# Patient Record
Sex: Female | Born: 2000 | Hispanic: No | Marital: Single | Smoking: Never smoker
Health system: Southern US, Community
[De-identification: ages and names within clinical notes are randomized; demographics above are authoritative.]

## PROBLEM LIST (undated history)

## (undated) DIAGNOSIS — F419 Anxiety disorder, unspecified: Secondary | ICD-10-CM

## (undated) DIAGNOSIS — H539 Unspecified visual disturbance: Secondary | ICD-10-CM

## (undated) DIAGNOSIS — S82892A Other fracture of left lower leg, initial encounter for closed fracture: Secondary | ICD-10-CM

## (undated) DIAGNOSIS — J45909 Unspecified asthma, uncomplicated: Secondary | ICD-10-CM

## (undated) DIAGNOSIS — E079 Disorder of thyroid, unspecified: Secondary | ICD-10-CM

## (undated) DIAGNOSIS — T7840XA Allergy, unspecified, initial encounter: Secondary | ICD-10-CM

## (undated) HISTORY — DX: Anxiety disorder, unspecified: F41.9

## (undated) HISTORY — DX: Disorder of thyroid, unspecified: E07.9

## (undated) HISTORY — PX: ANKLE ARTHROSCOPY W/ OPEN REPAIR: SHX1145

---
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
## 2020-01-25 DIAGNOSIS — F331 Major depressive disorder, recurrent, moderate: Secondary | ICD-10-CM | POA: Diagnosis not present

## 2020-02-13 DIAGNOSIS — F331 Major depressive disorder, recurrent, moderate: Secondary | ICD-10-CM | POA: Diagnosis not present

## 2020-02-20 DIAGNOSIS — F411 Generalized anxiety disorder: Secondary | ICD-10-CM | POA: Diagnosis not present

## 2020-02-20 DIAGNOSIS — F331 Major depressive disorder, recurrent, moderate: Secondary | ICD-10-CM | POA: Diagnosis not present

## 2020-02-22 DIAGNOSIS — F411 Generalized anxiety disorder: Secondary | ICD-10-CM | POA: Diagnosis not present

## 2020-02-22 DIAGNOSIS — F331 Major depressive disorder, recurrent, moderate: Secondary | ICD-10-CM | POA: Diagnosis not present

## 2020-02-29 DIAGNOSIS — H10413 Chronic giant papillary conjunctivitis, bilateral: Secondary | ICD-10-CM | POA: Diagnosis not present

## 2020-02-29 DIAGNOSIS — H52203 Unspecified astigmatism, bilateral: Secondary | ICD-10-CM | POA: Diagnosis not present

## 2020-02-29 DIAGNOSIS — H0102A Squamous blepharitis right eye, upper and lower eyelids: Secondary | ICD-10-CM | POA: Diagnosis not present

## 2020-02-29 DIAGNOSIS — H0102B Squamous blepharitis left eye, upper and lower eyelids: Secondary | ICD-10-CM | POA: Diagnosis not present

## 2020-02-29 DIAGNOSIS — H16423 Pannus (corneal), bilateral: Secondary | ICD-10-CM | POA: Diagnosis not present

## 2020-02-29 DIAGNOSIS — H5213 Myopia, bilateral: Secondary | ICD-10-CM | POA: Diagnosis not present

## 2020-03-01 DIAGNOSIS — F411 Generalized anxiety disorder: Secondary | ICD-10-CM | POA: Diagnosis not present

## 2020-03-19 DIAGNOSIS — H6123 Impacted cerumen, bilateral: Secondary | ICD-10-CM | POA: Diagnosis not present

## 2020-03-20 DIAGNOSIS — F411 Generalized anxiety disorder: Secondary | ICD-10-CM | POA: Diagnosis not present

## 2020-03-20 DIAGNOSIS — F331 Major depressive disorder, recurrent, moderate: Secondary | ICD-10-CM | POA: Diagnosis not present

## 2020-03-21 DIAGNOSIS — F411 Generalized anxiety disorder: Secondary | ICD-10-CM | POA: Diagnosis not present

## 2020-03-21 DIAGNOSIS — F331 Major depressive disorder, recurrent, moderate: Secondary | ICD-10-CM | POA: Diagnosis not present

## 2020-03-25 DIAGNOSIS — J029 Acute pharyngitis, unspecified: Secondary | ICD-10-CM | POA: Diagnosis not present

## 2020-03-25 DIAGNOSIS — Z1152 Encounter for screening for COVID-19: Secondary | ICD-10-CM | POA: Diagnosis not present

## 2020-03-27 DIAGNOSIS — F331 Major depressive disorder, recurrent, moderate: Secondary | ICD-10-CM | POA: Diagnosis not present

## 2020-03-28 DIAGNOSIS — F331 Major depressive disorder, recurrent, moderate: Secondary | ICD-10-CM | POA: Diagnosis not present

## 2020-03-28 DIAGNOSIS — F411 Generalized anxiety disorder: Secondary | ICD-10-CM | POA: Diagnosis not present

## 2020-04-01 DIAGNOSIS — L680 Hirsutism: Secondary | ICD-10-CM | POA: Diagnosis not present

## 2020-04-01 DIAGNOSIS — Z23 Encounter for immunization: Secondary | ICD-10-CM | POA: Diagnosis not present

## 2020-04-01 DIAGNOSIS — Z0001 Encounter for general adult medical examination with abnormal findings: Secondary | ICD-10-CM | POA: Diagnosis not present

## 2020-04-01 DIAGNOSIS — E039 Hypothyroidism, unspecified: Secondary | ICD-10-CM | POA: Diagnosis not present

## 2020-04-01 DIAGNOSIS — Z1331 Encounter for screening for depression: Secondary | ICD-10-CM | POA: Diagnosis not present

## 2020-04-01 DIAGNOSIS — L700 Acne vulgaris: Secondary | ICD-10-CM | POA: Diagnosis not present

## 2020-04-02 DIAGNOSIS — F331 Major depressive disorder, recurrent, moderate: Secondary | ICD-10-CM | POA: Diagnosis not present

## 2020-04-22 DIAGNOSIS — F331 Major depressive disorder, recurrent, moderate: Secondary | ICD-10-CM | POA: Diagnosis not present

## 2020-04-26 DIAGNOSIS — E039 Hypothyroidism, unspecified: Secondary | ICD-10-CM | POA: Diagnosis not present

## 2020-04-30 DIAGNOSIS — E039 Hypothyroidism, unspecified: Secondary | ICD-10-CM | POA: Diagnosis not present

## 2020-05-05 DIAGNOSIS — Z20822 Contact with and (suspected) exposure to covid-19: Secondary | ICD-10-CM | POA: Diagnosis not present

## 2020-06-19 DIAGNOSIS — H6123 Impacted cerumen, bilateral: Secondary | ICD-10-CM | POA: Diagnosis not present

## 2020-06-19 DIAGNOSIS — H61303 Acquired stenosis of external ear canal, unspecified, bilateral: Secondary | ICD-10-CM | POA: Diagnosis not present

## 2020-06-19 DIAGNOSIS — H60332 Swimmer's ear, left ear: Secondary | ICD-10-CM | POA: Diagnosis not present

## 2020-06-20 DIAGNOSIS — J4521 Mild intermittent asthma with (acute) exacerbation: Secondary | ICD-10-CM | POA: Diagnosis not present

## 2020-06-20 DIAGNOSIS — J019 Acute sinusitis, unspecified: Secondary | ICD-10-CM | POA: Diagnosis not present

## 2020-07-01 DIAGNOSIS — H61303 Acquired stenosis of external ear canal, unspecified, bilateral: Secondary | ICD-10-CM | POA: Diagnosis not present

## 2020-07-01 DIAGNOSIS — H6123 Impacted cerumen, bilateral: Secondary | ICD-10-CM | POA: Diagnosis not present

## 2020-07-01 DIAGNOSIS — H60332 Swimmer's ear, left ear: Secondary | ICD-10-CM | POA: Diagnosis not present

## 2020-07-18 DIAGNOSIS — S93492D Sprain of other ligament of left ankle, subsequent encounter: Secondary | ICD-10-CM | POA: Diagnosis not present

## 2020-07-24 ENCOUNTER — Ambulatory Visit: Payer: BLUE CROSS/BLUE SHIELD | Admitting: Family Medicine

## 2020-08-14 ENCOUNTER — Ambulatory Visit: Payer: BLUE CROSS/BLUE SHIELD | Admitting: Family Medicine

## 2020-08-28 DIAGNOSIS — H6123 Impacted cerumen, bilateral: Secondary | ICD-10-CM | POA: Diagnosis not present

## 2020-09-30 DIAGNOSIS — Z20828 Contact with and (suspected) exposure to other viral communicable diseases: Secondary | ICD-10-CM | POA: Diagnosis not present

## 2020-09-30 DIAGNOSIS — B338 Other specified viral diseases: Secondary | ICD-10-CM | POA: Diagnosis not present

## 2020-11-11 DIAGNOSIS — Z0000 Encounter for general adult medical examination without abnormal findings: Secondary | ICD-10-CM | POA: Diagnosis not present

## 2020-11-11 DIAGNOSIS — E559 Vitamin D deficiency, unspecified: Secondary | ICD-10-CM | POA: Diagnosis not present

## 2020-11-11 DIAGNOSIS — E039 Hypothyroidism, unspecified: Secondary | ICD-10-CM | POA: Diagnosis not present

## 2020-11-12 DIAGNOSIS — E039 Hypothyroidism, unspecified: Secondary | ICD-10-CM | POA: Diagnosis not present

## 2020-11-15 DIAGNOSIS — H52223 Regular astigmatism, bilateral: Secondary | ICD-10-CM | POA: Diagnosis not present

## 2020-12-12 DIAGNOSIS — J069 Acute upper respiratory infection, unspecified: Secondary | ICD-10-CM | POA: Diagnosis not present

## 2020-12-12 DIAGNOSIS — Z23 Encounter for immunization: Secondary | ICD-10-CM | POA: Diagnosis not present

## 2021-04-07 DIAGNOSIS — E039 Hypothyroidism, unspecified: Secondary | ICD-10-CM | POA: Diagnosis not present

## 2021-04-07 DIAGNOSIS — Z1331 Encounter for screening for depression: Secondary | ICD-10-CM | POA: Diagnosis not present

## 2021-04-07 DIAGNOSIS — Z0001 Encounter for general adult medical examination with abnormal findings: Secondary | ICD-10-CM | POA: Diagnosis not present

## 2021-04-07 DIAGNOSIS — Z6828 Body mass index (BMI) 28.0-28.9, adult: Secondary | ICD-10-CM | POA: Diagnosis not present

## 2021-04-07 DIAGNOSIS — Z23 Encounter for immunization: Secondary | ICD-10-CM | POA: Diagnosis not present

## 2021-04-07 DIAGNOSIS — F419 Anxiety disorder, unspecified: Secondary | ICD-10-CM | POA: Diagnosis not present

## 2021-04-07 DIAGNOSIS — Z0000 Encounter for general adult medical examination without abnormal findings: Secondary | ICD-10-CM | POA: Diagnosis not present

## 2021-04-07 DIAGNOSIS — Z713 Dietary counseling and surveillance: Secondary | ICD-10-CM | POA: Diagnosis not present

## 2021-04-09 DIAGNOSIS — E039 Hypothyroidism, unspecified: Secondary | ICD-10-CM | POA: Diagnosis not present

## 2021-07-21 DIAGNOSIS — J019 Acute sinusitis, unspecified: Secondary | ICD-10-CM | POA: Diagnosis not present

## 2021-07-21 DIAGNOSIS — J029 Acute pharyngitis, unspecified: Secondary | ICD-10-CM | POA: Diagnosis not present

## 2021-07-25 DIAGNOSIS — J4521 Mild intermittent asthma with (acute) exacerbation: Secondary | ICD-10-CM | POA: Diagnosis not present

## 2021-07-25 DIAGNOSIS — J209 Acute bronchitis, unspecified: Secondary | ICD-10-CM | POA: Diagnosis not present

## 2021-08-15 DIAGNOSIS — M549 Dorsalgia, unspecified: Secondary | ICD-10-CM | POA: Diagnosis not present

## 2021-08-15 DIAGNOSIS — R300 Dysuria: Secondary | ICD-10-CM | POA: Diagnosis not present

## 2021-09-08 DIAGNOSIS — Z79899 Other long term (current) drug therapy: Secondary | ICD-10-CM | POA: Diagnosis not present

## 2021-09-08 DIAGNOSIS — E039 Hypothyroidism, unspecified: Secondary | ICD-10-CM | POA: Diagnosis not present

## 2021-09-08 DIAGNOSIS — E559 Vitamin D deficiency, unspecified: Secondary | ICD-10-CM | POA: Diagnosis not present

## 2021-09-11 DIAGNOSIS — E039 Hypothyroidism, unspecified: Secondary | ICD-10-CM | POA: Diagnosis not present

## 2021-10-30 DIAGNOSIS — F411 Generalized anxiety disorder: Secondary | ICD-10-CM | POA: Diagnosis not present

## 2021-10-30 DIAGNOSIS — E039 Hypothyroidism, unspecified: Secondary | ICD-10-CM | POA: Diagnosis not present

## 2021-10-30 DIAGNOSIS — Z91018 Allergy to other foods: Secondary | ICD-10-CM | POA: Diagnosis not present

## 2021-10-30 DIAGNOSIS — F330 Major depressive disorder, recurrent, mild: Secondary | ICD-10-CM | POA: Diagnosis not present

## 2022-02-11 DIAGNOSIS — S6392XA Sprain of unspecified part of left wrist and hand, initial encounter: Secondary | ICD-10-CM | POA: Diagnosis not present

## 2022-02-18 DIAGNOSIS — H9202 Otalgia, left ear: Secondary | ICD-10-CM | POA: Diagnosis not present

## 2022-02-18 DIAGNOSIS — Z77122 Contact with and (suspected) exposure to noise: Secondary | ICD-10-CM | POA: Diagnosis not present

## 2022-02-18 DIAGNOSIS — H9313 Tinnitus, bilateral: Secondary | ICD-10-CM | POA: Diagnosis not present

## 2022-02-18 DIAGNOSIS — H6123 Impacted cerumen, bilateral: Secondary | ICD-10-CM | POA: Diagnosis not present

## 2022-02-18 DIAGNOSIS — H9312 Tinnitus, left ear: Secondary | ICD-10-CM | POA: Diagnosis not present

## 2022-02-19 DIAGNOSIS — R0982 Postnasal drip: Secondary | ICD-10-CM | POA: Diagnosis not present

## 2022-02-19 DIAGNOSIS — Z03818 Encounter for observation for suspected exposure to other biological agents ruled out: Secondary | ICD-10-CM | POA: Diagnosis not present

## 2022-02-19 DIAGNOSIS — J029 Acute pharyngitis, unspecified: Secondary | ICD-10-CM | POA: Diagnosis not present

## 2022-03-19 DIAGNOSIS — E041 Nontoxic single thyroid nodule: Secondary | ICD-10-CM | POA: Diagnosis not present

## 2022-03-19 DIAGNOSIS — E559 Vitamin D deficiency, unspecified: Secondary | ICD-10-CM | POA: Diagnosis not present

## 2022-03-19 DIAGNOSIS — E039 Hypothyroidism, unspecified: Secondary | ICD-10-CM | POA: Diagnosis not present

## 2022-03-20 DIAGNOSIS — E039 Hypothyroidism, unspecified: Secondary | ICD-10-CM | POA: Diagnosis not present

## 2022-03-27 DIAGNOSIS — M25571 Pain in right ankle and joints of right foot: Secondary | ICD-10-CM | POA: Diagnosis not present

## 2022-04-08 DIAGNOSIS — M79674 Pain in right toe(s): Secondary | ICD-10-CM | POA: Diagnosis not present

## 2022-04-16 DIAGNOSIS — M79671 Pain in right foot: Secondary | ICD-10-CM | POA: Diagnosis not present

## 2022-04-17 DIAGNOSIS — M79674 Pain in right toe(s): Secondary | ICD-10-CM | POA: Diagnosis not present

## 2022-05-15 DIAGNOSIS — M79674 Pain in right toe(s): Secondary | ICD-10-CM | POA: Diagnosis not present

## 2022-06-12 DIAGNOSIS — R300 Dysuria: Secondary | ICD-10-CM | POA: Diagnosis not present

## 2022-06-12 DIAGNOSIS — M79671 Pain in right foot: Secondary | ICD-10-CM | POA: Diagnosis not present

## 2022-06-12 DIAGNOSIS — N898 Other specified noninflammatory disorders of vagina: Secondary | ICD-10-CM | POA: Diagnosis not present

## 2022-06-15 DIAGNOSIS — M79674 Pain in right toe(s): Secondary | ICD-10-CM | POA: Diagnosis not present

## 2022-06-29 DIAGNOSIS — M79674 Pain in right toe(s): Secondary | ICD-10-CM | POA: Diagnosis not present

## 2022-06-29 DIAGNOSIS — M79671 Pain in right foot: Secondary | ICD-10-CM | POA: Diagnosis not present

## 2022-07-01 ENCOUNTER — Other Ambulatory Visit: Payer: Self-pay | Admitting: Orthopaedic Surgery

## 2022-07-01 DIAGNOSIS — M79671 Pain in right foot: Secondary | ICD-10-CM

## 2022-07-02 ENCOUNTER — Ambulatory Visit: Payer: BLUE CROSS/BLUE SHIELD | Admitting: Podiatry

## 2022-07-12 ENCOUNTER — Ambulatory Visit
Admission: RE | Admit: 2022-07-12 | Discharge: 2022-07-12 | Disposition: A | Discharge status: Home / Self Care | Payer: BLUE CROSS/BLUE SHIELD | Source: Ambulatory Visit | Attending: Orthopaedic Surgery | Admitting: Orthopaedic Surgery

## 2022-07-12 DIAGNOSIS — R600 Localized edema: Secondary | ICD-10-CM | POA: Diagnosis not present

## 2022-07-12 DIAGNOSIS — M25474 Effusion, right foot: Secondary | ICD-10-CM | POA: Diagnosis not present

## 2022-07-12 DIAGNOSIS — M79671 Pain in right foot: Secondary | ICD-10-CM

## 2022-07-15 DIAGNOSIS — M79674 Pain in right toe(s): Secondary | ICD-10-CM | POA: Diagnosis not present

## 2022-07-20 ENCOUNTER — Other Ambulatory Visit: Payer: BLUE CROSS/BLUE SHIELD

## 2022-08-12 DIAGNOSIS — M79674 Pain in right toe(s): Secondary | ICD-10-CM | POA: Diagnosis not present

## 2022-08-24 DIAGNOSIS — Z0110 Encounter for examination of ears and hearing without abnormal findings: Secondary | ICD-10-CM | POA: Diagnosis not present

## 2022-08-24 DIAGNOSIS — H6123 Impacted cerumen, bilateral: Secondary | ICD-10-CM | POA: Diagnosis not present

## 2022-08-24 DIAGNOSIS — H9313 Tinnitus, bilateral: Secondary | ICD-10-CM | POA: Diagnosis not present

## 2022-08-24 DIAGNOSIS — H93293 Other abnormal auditory perceptions, bilateral: Secondary | ICD-10-CM | POA: Diagnosis not present

## 2022-09-08 DIAGNOSIS — M25674 Stiffness of right foot, not elsewhere classified: Secondary | ICD-10-CM | POA: Diagnosis not present

## 2022-09-08 DIAGNOSIS — M25671 Stiffness of right ankle, not elsewhere classified: Secondary | ICD-10-CM | POA: Diagnosis not present

## 2022-09-08 DIAGNOSIS — R262 Difficulty in walking, not elsewhere classified: Secondary | ICD-10-CM | POA: Diagnosis not present

## 2022-09-09 DIAGNOSIS — M25674 Stiffness of right foot, not elsewhere classified: Secondary | ICD-10-CM | POA: Diagnosis not present

## 2022-09-09 DIAGNOSIS — M25671 Stiffness of right ankle, not elsewhere classified: Secondary | ICD-10-CM | POA: Diagnosis not present

## 2022-09-09 DIAGNOSIS — R262 Difficulty in walking, not elsewhere classified: Secondary | ICD-10-CM | POA: Diagnosis not present

## 2022-09-14 DIAGNOSIS — R262 Difficulty in walking, not elsewhere classified: Secondary | ICD-10-CM | POA: Diagnosis not present

## 2022-09-14 DIAGNOSIS — M25671 Stiffness of right ankle, not elsewhere classified: Secondary | ICD-10-CM | POA: Diagnosis not present

## 2022-09-14 DIAGNOSIS — M25674 Stiffness of right foot, not elsewhere classified: Secondary | ICD-10-CM | POA: Diagnosis not present

## 2022-09-18 DIAGNOSIS — M25671 Stiffness of right ankle, not elsewhere classified: Secondary | ICD-10-CM | POA: Diagnosis not present

## 2022-09-18 DIAGNOSIS — M25674 Stiffness of right foot, not elsewhere classified: Secondary | ICD-10-CM | POA: Diagnosis not present

## 2022-09-18 DIAGNOSIS — R262 Difficulty in walking, not elsewhere classified: Secondary | ICD-10-CM | POA: Diagnosis not present

## 2022-09-30 DIAGNOSIS — R262 Difficulty in walking, not elsewhere classified: Secondary | ICD-10-CM | POA: Diagnosis not present

## 2022-09-30 DIAGNOSIS — M25674 Stiffness of right foot, not elsewhere classified: Secondary | ICD-10-CM | POA: Diagnosis not present

## 2022-09-30 DIAGNOSIS — M25671 Stiffness of right ankle, not elsewhere classified: Secondary | ICD-10-CM | POA: Diagnosis not present

## 2022-10-02 DIAGNOSIS — M25674 Stiffness of right foot, not elsewhere classified: Secondary | ICD-10-CM | POA: Diagnosis not present

## 2022-10-02 DIAGNOSIS — M25671 Stiffness of right ankle, not elsewhere classified: Secondary | ICD-10-CM | POA: Diagnosis not present

## 2022-10-02 DIAGNOSIS — R262 Difficulty in walking, not elsewhere classified: Secondary | ICD-10-CM | POA: Diagnosis not present

## 2022-10-07 DIAGNOSIS — R262 Difficulty in walking, not elsewhere classified: Secondary | ICD-10-CM | POA: Diagnosis not present

## 2022-10-07 DIAGNOSIS — M25674 Stiffness of right foot, not elsewhere classified: Secondary | ICD-10-CM | POA: Diagnosis not present

## 2022-10-07 DIAGNOSIS — M25671 Stiffness of right ankle, not elsewhere classified: Secondary | ICD-10-CM | POA: Diagnosis not present

## 2022-10-14 DIAGNOSIS — R262 Difficulty in walking, not elsewhere classified: Secondary | ICD-10-CM | POA: Diagnosis not present

## 2022-10-14 DIAGNOSIS — M25671 Stiffness of right ankle, not elsewhere classified: Secondary | ICD-10-CM | POA: Diagnosis not present

## 2022-10-14 DIAGNOSIS — M25674 Stiffness of right foot, not elsewhere classified: Secondary | ICD-10-CM | POA: Diagnosis not present

## 2022-10-15 DIAGNOSIS — M25562 Pain in left knee: Secondary | ICD-10-CM | POA: Diagnosis not present

## 2022-10-26 DIAGNOSIS — L259 Unspecified contact dermatitis, unspecified cause: Secondary | ICD-10-CM | POA: Diagnosis not present

## 2022-11-05 DIAGNOSIS — E039 Hypothyroidism, unspecified: Secondary | ICD-10-CM | POA: Diagnosis not present

## 2022-11-10 DIAGNOSIS — E559 Vitamin D deficiency, unspecified: Secondary | ICD-10-CM | POA: Diagnosis not present

## 2022-11-10 DIAGNOSIS — E039 Hypothyroidism, unspecified: Secondary | ICD-10-CM | POA: Diagnosis not present

## 2022-11-11 DIAGNOSIS — J069 Acute upper respiratory infection, unspecified: Secondary | ICD-10-CM | POA: Diagnosis not present

## 2022-12-25 DIAGNOSIS — H52223 Regular astigmatism, bilateral: Secondary | ICD-10-CM | POA: Diagnosis not present

## 2023-02-23 DIAGNOSIS — H6123 Impacted cerumen, bilateral: Secondary | ICD-10-CM | POA: Diagnosis not present

## 2023-03-03 DIAGNOSIS — Z124 Encounter for screening for malignant neoplasm of cervix: Secondary | ICD-10-CM | POA: Diagnosis not present

## 2023-03-03 DIAGNOSIS — X509XXA Other and unspecified overexertion or strenuous movements or postures, initial encounter: Secondary | ICD-10-CM | POA: Diagnosis not present

## 2023-03-03 DIAGNOSIS — E039 Hypothyroidism, unspecified: Secondary | ICD-10-CM | POA: Diagnosis not present

## 2023-03-03 DIAGNOSIS — Z0000 Encounter for general adult medical examination without abnormal findings: Secondary | ICD-10-CM | POA: Diagnosis not present

## 2023-03-03 DIAGNOSIS — E559 Vitamin D deficiency, unspecified: Secondary | ICD-10-CM | POA: Diagnosis not present

## 2023-03-03 DIAGNOSIS — Z13220 Encounter for screening for lipoid disorders: Secondary | ICD-10-CM | POA: Diagnosis not present

## 2023-03-03 DIAGNOSIS — R42 Dizziness and giddiness: Secondary | ICD-10-CM | POA: Diagnosis not present

## 2023-03-24 DIAGNOSIS — J069 Acute upper respiratory infection, unspecified: Secondary | ICD-10-CM | POA: Diagnosis not present

## 2023-03-24 DIAGNOSIS — R059 Cough, unspecified: Secondary | ICD-10-CM | POA: Diagnosis not present

## 2023-05-03 DIAGNOSIS — E039 Hypothyroidism, unspecified: Secondary | ICD-10-CM | POA: Diagnosis not present

## 2023-05-03 DIAGNOSIS — E559 Vitamin D deficiency, unspecified: Secondary | ICD-10-CM | POA: Diagnosis not present

## 2023-05-05 DIAGNOSIS — M79671 Pain in right foot: Secondary | ICD-10-CM | POA: Diagnosis not present

## 2023-05-06 DIAGNOSIS — E559 Vitamin D deficiency, unspecified: Secondary | ICD-10-CM | POA: Diagnosis not present

## 2023-05-06 DIAGNOSIS — E039 Hypothyroidism, unspecified: Secondary | ICD-10-CM | POA: Diagnosis not present

## 2023-05-18 DIAGNOSIS — E039 Hypothyroidism, unspecified: Secondary | ICD-10-CM | POA: Diagnosis not present

## 2023-08-24 DIAGNOSIS — H6123 Impacted cerumen, bilateral: Secondary | ICD-10-CM | POA: Diagnosis not present

## 2023-10-07 DIAGNOSIS — E039 Hypothyroidism, unspecified: Secondary | ICD-10-CM | POA: Diagnosis not present

## 2023-10-08 DIAGNOSIS — E039 Hypothyroidism, unspecified: Secondary | ICD-10-CM | POA: Diagnosis not present

## 2023-10-27 DIAGNOSIS — H52223 Regular astigmatism, bilateral: Secondary | ICD-10-CM | POA: Diagnosis not present

## 2023-11-02 DIAGNOSIS — M25531 Pain in right wrist: Secondary | ICD-10-CM | POA: Diagnosis not present

## 2023-12-21 DIAGNOSIS — M25562 Pain in left knee: Secondary | ICD-10-CM | POA: Diagnosis not present

## 2024-01-03 DIAGNOSIS — M6752 Plica syndrome, left knee: Secondary | ICD-10-CM | POA: Diagnosis not present

## 2024-01-03 DIAGNOSIS — M25662 Stiffness of left knee, not elsewhere classified: Secondary | ICD-10-CM | POA: Diagnosis not present

## 2024-01-05 DIAGNOSIS — M25662 Stiffness of left knee, not elsewhere classified: Secondary | ICD-10-CM | POA: Diagnosis not present

## 2024-01-05 DIAGNOSIS — M6752 Plica syndrome, left knee: Secondary | ICD-10-CM | POA: Diagnosis not present

## 2024-01-10 DIAGNOSIS — M25662 Stiffness of left knee, not elsewhere classified: Secondary | ICD-10-CM | POA: Diagnosis not present

## 2024-01-10 DIAGNOSIS — M6752 Plica syndrome, left knee: Secondary | ICD-10-CM | POA: Diagnosis not present

## 2024-01-19 DIAGNOSIS — M6752 Plica syndrome, left knee: Secondary | ICD-10-CM | POA: Diagnosis not present

## 2024-01-19 DIAGNOSIS — M25662 Stiffness of left knee, not elsewhere classified: Secondary | ICD-10-CM | POA: Diagnosis not present

## 2024-03-06 DIAGNOSIS — Z0000 Encounter for general adult medical examination without abnormal findings: Secondary | ICD-10-CM | POA: Diagnosis not present

## 2024-03-06 DIAGNOSIS — E039 Hypothyroidism, unspecified: Secondary | ICD-10-CM | POA: Diagnosis not present

## 2024-03-06 DIAGNOSIS — Z23 Encounter for immunization: Secondary | ICD-10-CM | POA: Diagnosis not present

## 2024-03-06 DIAGNOSIS — E559 Vitamin D deficiency, unspecified: Secondary | ICD-10-CM | POA: Diagnosis not present

## 2024-03-06 DIAGNOSIS — F411 Generalized anxiety disorder: Secondary | ICD-10-CM | POA: Diagnosis not present

## 2024-03-06 DIAGNOSIS — D500 Iron deficiency anemia secondary to blood loss (chronic): Secondary | ICD-10-CM | POA: Diagnosis not present

## 2024-03-06 DIAGNOSIS — E7800 Pure hypercholesterolemia, unspecified: Secondary | ICD-10-CM | POA: Diagnosis not present

## 2024-03-06 DIAGNOSIS — Z1388 Encounter for screening for disorder due to exposure to contaminants: Secondary | ICD-10-CM | POA: Diagnosis not present

## 2024-03-06 DIAGNOSIS — Z91018 Allergy to other foods: Secondary | ICD-10-CM | POA: Diagnosis not present

## 2024-04-04 DIAGNOSIS — E039 Hypothyroidism, unspecified: Secondary | ICD-10-CM | POA: Diagnosis not present

## 2024-04-21 DIAGNOSIS — Z113 Encounter for screening for infections with a predominantly sexual mode of transmission: Secondary | ICD-10-CM | POA: Diagnosis not present

## 2024-04-21 DIAGNOSIS — Z124 Encounter for screening for malignant neoplasm of cervix: Secondary | ICD-10-CM | POA: Diagnosis not present

## 2024-04-21 DIAGNOSIS — Z01419 Encounter for gynecological examination (general) (routine) without abnormal findings: Secondary | ICD-10-CM | POA: Diagnosis not present

## 2024-04-21 DIAGNOSIS — Z6820 Body mass index (BMI) 20.0-20.9, adult: Secondary | ICD-10-CM | POA: Diagnosis not present
